# Patient Record
Sex: Female | Born: 1975 | Race: White | Hispanic: No | Marital: Married | Smoking: Never smoker
Health system: Southern US, Community
[De-identification: ages and names within clinical notes are randomized; demographics above are authoritative.]

## PROBLEM LIST (undated history)

## (undated) DIAGNOSIS — E282 Polycystic ovarian syndrome: Secondary | ICD-10-CM

## (undated) DIAGNOSIS — K635 Polyp of colon: Secondary | ICD-10-CM

## (undated) DIAGNOSIS — F32A Depression, unspecified: Secondary | ICD-10-CM

## (undated) DIAGNOSIS — F419 Anxiety disorder, unspecified: Secondary | ICD-10-CM

## (undated) DIAGNOSIS — I447 Left bundle-branch block, unspecified: Secondary | ICD-10-CM

## (undated) DIAGNOSIS — F329 Major depressive disorder, single episode, unspecified: Secondary | ICD-10-CM

## (undated) DIAGNOSIS — J45909 Unspecified asthma, uncomplicated: Secondary | ICD-10-CM

## (undated) HISTORY — PX: BREAST BIOPSY: SHX20

## (undated) HISTORY — DX: Polyp of colon: K63.5

---

## 2011-05-05 ENCOUNTER — Other Ambulatory Visit (HOSPITAL_COMMUNITY)
Admission: RE | Admit: 2011-05-05 | Discharge: 2011-05-05 | Disposition: A | Discharge status: Home / Self Care | Payer: 59 | Source: Ambulatory Visit | Attending: Obstetrics & Gynecology | Admitting: Obstetrics & Gynecology

## 2011-05-05 DIAGNOSIS — Z124 Encounter for screening for malignant neoplasm of cervix: Secondary | ICD-10-CM | POA: Insufficient documentation

## 2011-05-05 DIAGNOSIS — Z1159 Encounter for screening for other viral diseases: Secondary | ICD-10-CM | POA: Insufficient documentation

## 2011-11-11 ENCOUNTER — Encounter (HOSPITAL_BASED_OUTPATIENT_CLINIC_OR_DEPARTMENT_OTHER): Payer: Self-pay | Admitting: *Deleted

## 2011-11-17 ENCOUNTER — Encounter (HOSPITAL_BASED_OUTPATIENT_CLINIC_OR_DEPARTMENT_OTHER): Payer: Self-pay | Admitting: *Deleted

## 2011-11-17 ENCOUNTER — Ambulatory Visit (HOSPITAL_BASED_OUTPATIENT_CLINIC_OR_DEPARTMENT_OTHER): Payer: 59 | Admitting: Anesthesiology

## 2011-11-17 ENCOUNTER — Ambulatory Visit (HOSPITAL_BASED_OUTPATIENT_CLINIC_OR_DEPARTMENT_OTHER)
Admission: RE | Admit: 2011-11-17 | Discharge: 2011-11-17 | Disposition: A | Discharge status: Home / Self Care | Payer: 59 | Source: Ambulatory Visit | Attending: Plastic Surgery | Admitting: Plastic Surgery

## 2011-11-17 ENCOUNTER — Encounter (HOSPITAL_BASED_OUTPATIENT_CLINIC_OR_DEPARTMENT_OTHER): Payer: Self-pay | Admitting: Anesthesiology

## 2011-11-17 ENCOUNTER — Encounter (HOSPITAL_BASED_OUTPATIENT_CLINIC_OR_DEPARTMENT_OTHER)
Admission: RE | Disposition: A | Discharge status: Home / Self Care | Payer: Self-pay | Source: Ambulatory Visit | Attending: Plastic Surgery

## 2011-11-17 DIAGNOSIS — N62 Hypertrophy of breast: Secondary | ICD-10-CM | POA: Insufficient documentation

## 2011-11-17 HISTORY — DX: Major depressive disorder, single episode, unspecified: F32.9

## 2011-11-17 HISTORY — PX: BREAST REDUCTION SURGERY: SHX8

## 2011-11-17 HISTORY — DX: Polycystic ovarian syndrome: E28.2

## 2011-11-17 HISTORY — DX: Unspecified asthma, uncomplicated: J45.909

## 2011-11-17 HISTORY — DX: Anxiety disorder, unspecified: F41.9

## 2011-11-17 HISTORY — DX: Depression, unspecified: F32.A

## 2011-11-17 LAB — POCT HEMOGLOBIN-HEMACUE: Hemoglobin: 13.8 g/dL (ref 12.0–15.0)

## 2011-11-17 SURGERY — MAMMOPLASTY, REDUCTION
Anesthesia: General | Site: Breast | Laterality: Bilateral | Wound class: Clean

## 2011-11-17 MED ORDER — ONDANSETRON HCL 4 MG/2ML IJ SOLN
INTRAMUSCULAR | Status: DC | PRN
  Administered 2011-11-17: 4 mg via INTRAVENOUS

## 2011-11-17 MED ORDER — DEXAMETHASONE SODIUM PHOSPHATE 4 MG/ML IJ SOLN
INTRAMUSCULAR | Status: DC | PRN
  Administered 2011-11-17: 10 mg via INTRAVENOUS

## 2011-11-17 MED ORDER — LACTATED RINGERS IV SOLN
INTRAVENOUS | Status: DC
  Administered 2011-11-17 (×5): via INTRAVENOUS

## 2011-11-17 MED ORDER — BUPIVACAINE LIPOSOME 1.3 % IJ SUSP
INTRAMUSCULAR | Status: DC | PRN
  Administered 2011-11-17: 20 mL

## 2011-11-17 MED ORDER — METOCLOPRAMIDE HCL 5 MG/ML IJ SOLN
INTRAMUSCULAR | Status: DC | PRN
  Administered 2011-11-17: 10 mg via INTRAVENOUS

## 2011-11-17 MED ORDER — FENTANYL CITRATE 0.05 MG/ML IJ SOLN
INTRAMUSCULAR | Status: DC | PRN
  Administered 2011-11-17: 25 ug via INTRAVENOUS
  Administered 2011-11-17 (×3): 50 ug via INTRAVENOUS
  Administered 2011-11-17: 25 ug via INTRAVENOUS
  Administered 2011-11-17: 50 ug via INTRAVENOUS
  Administered 2011-11-17: 100 ug via INTRAVENOUS
  Administered 2011-11-17: 50 ug via INTRAVENOUS
  Administered 2011-11-17: 25 ug via INTRAVENOUS
  Administered 2011-11-17: 100 ug via INTRAVENOUS
  Administered 2011-11-17: 50 ug via INTRAVENOUS
  Administered 2011-11-17: 25 ug via INTRAVENOUS

## 2011-11-17 MED ORDER — OXYCODONE HCL 5 MG/5ML PO SOLN: 5.0000 mg | Freq: Once | ORAL | Status: DC | PRN

## 2011-11-17 MED ORDER — LIDOCAINE-EPINEPHRINE 1 %-1:100000 IJ SOLN
INTRAMUSCULAR | Status: DC | PRN
  Administered 2011-11-17: 40 mL

## 2011-11-17 MED ORDER — OXYCODONE HCL 5 MG PO TABS: 5.0000 mg | ORAL_TABLET | Freq: Once | ORAL | Status: DC | PRN

## 2011-11-17 MED ORDER — SODIUM CHLORIDE 0.9 % IJ SOLN
INTRAMUSCULAR | Status: DC | PRN
  Administered 2011-11-17: 60 mL

## 2011-11-17 MED ORDER — CEFAZOLIN SODIUM 1-5 GM-% IV SOLN
1.0000 g | Freq: Once | INTRAVENOUS | Status: AC
  Administered 2011-11-17: 2 g via INTRAVENOUS

## 2011-11-17 MED ORDER — PROMETHAZINE HCL 25 MG/ML IJ SOLN: 6.2500 mg | INTRAMUSCULAR | Status: DC | PRN

## 2011-11-17 MED ORDER — MIDAZOLAM HCL 5 MG/5ML IJ SOLN
INTRAMUSCULAR | Status: DC | PRN
  Administered 2011-11-17: 2 mg via INTRAVENOUS

## 2011-11-17 MED ORDER — HYDROMORPHONE HCL PF 1 MG/ML IJ SOLN: 0.2500 mg | INTRAMUSCULAR | Status: DC | PRN

## 2011-11-17 MED ORDER — ACETAMINOPHEN 10 MG/ML IV SOLN
1000.0000 mg | Freq: Once | INTRAVENOUS | Status: AC
  Administered 2011-11-17: 1000 mg via INTRAVENOUS

## 2011-11-17 MED ORDER — LIDOCAINE HCL (CARDIAC) 10 MG/ML IV SOLN
INTRAVENOUS | Status: DC | PRN
  Administered 2011-11-17: 80 mg via INTRAVENOUS

## 2011-11-17 MED ORDER — SUCCINYLCHOLINE CHLORIDE 20 MG/ML IJ SOLN
INTRAMUSCULAR | Status: DC | PRN
  Administered 2011-11-17: 120 mg via INTRAVENOUS

## 2011-11-17 MED ORDER — PROPOFOL 10 MG/ML IV BOLUS
INTRAVENOUS | Status: DC | PRN
  Administered 2011-11-17: 20 mg via INTRAVENOUS
  Administered 2011-11-17: 180 mg via INTRAVENOUS
  Administered 2011-11-17: 20 mg via INTRAVENOUS

## 2011-11-17 SURGICAL SUPPLY — 57 items
BANDAGE GAUZE ELAST BULKY 4 IN (GAUZE/BANDAGES/DRESSINGS) ×4 IMPLANT
BENZOIN TINCTURE PRP APPL 2/3 (GAUZE/BANDAGES/DRESSINGS) ×4 IMPLANT
BLADE KNIFE PERSONA 10 (BLADE) ×8 IMPLANT
BLADE KNIFE PERSONA 15 (BLADE) ×6 IMPLANT
CANISTER SUCTION 1200CC (MISCELLANEOUS) ×2 IMPLANT
CAP BOUFFANT 24 BLUE NURSES (PROTECTIVE WEAR) ×2 IMPLANT
CLOTH BEACON ORANGE TIMEOUT ST (SAFETY) ×2 IMPLANT
COVER MAYO STAND STRL (DRAPES) ×2 IMPLANT
COVER TABLE BACK 60X90 (DRAPES) ×2 IMPLANT
DECANTER SPIKE VIAL GLASS SM (MISCELLANEOUS) ×4 IMPLANT
DRAIN CHANNEL 10F 3/8 F FF (DRAIN) ×4 IMPLANT
DRAPE LAPAROSCOPIC ABDOMINAL (DRAPES) ×2 IMPLANT
DRSG EMULSION OIL 3X3 NADH (GAUZE/BANDAGES/DRESSINGS) ×4 IMPLANT
DRSG PAD ABDOMINAL 8X10 ST (GAUZE/BANDAGES/DRESSINGS) ×4 IMPLANT
ELECT NEEDLE TIP 2.8 STRL (NEEDLE) IMPLANT
ELECT REM PT RETURN 9FT ADLT (ELECTROSURGICAL) ×2
ELECTRODE REM PT RTRN 9FT ADLT (ELECTROSURGICAL) ×1 IMPLANT
EVACUATOR SILICONE 100CC (DRAIN) ×4 IMPLANT
FILTER 7/8 IN (FILTER) ×2 IMPLANT
GLOVE BIO SURGEON STRL SZ7 (GLOVE) ×2 IMPLANT
GLOVE BIOGEL M 7.0 STRL (GLOVE) ×2 IMPLANT
GLOVE BIOGEL PI IND STRL 7.5 (GLOVE) ×1 IMPLANT
GLOVE BIOGEL PI INDICATOR 7.5 (GLOVE) ×1
GLOVE ECLIPSE 6.5 STRL STRAW (GLOVE) ×10 IMPLANT
GOWN PREVENTION PLUS XLARGE (GOWN DISPOSABLE) ×8 IMPLANT
NEEDLE HYPO 25X1 1.5 SAFETY (NEEDLE) ×2 IMPLANT
NEEDLE SPNL 18GX3.5 QUINCKE PK (NEEDLE) ×2 IMPLANT
NS IRRIG 1000ML POUR BTL (IV SOLUTION) ×4 IMPLANT
PACK BASIN DAY SURGERY FS (CUSTOM PROCEDURE TRAY) ×2 IMPLANT
PIN SAFETY STERILE (MISCELLANEOUS) ×2 IMPLANT
SCRUB PCMX 4 OZ (MISCELLANEOUS) ×2 IMPLANT
SCRUB TECHNI CARE SURGICAL (MISCELLANEOUS) IMPLANT
SLEEVE SCD COMPRESS KNEE MED (MISCELLANEOUS) ×2 IMPLANT
SPECIMEN JAR MEDIUM (MISCELLANEOUS) ×6 IMPLANT
SPECIMEN JAR X LARGE (MISCELLANEOUS) ×4 IMPLANT
SPONGE GAUZE 4X4 12PLY (GAUZE/BANDAGES/DRESSINGS) ×4 IMPLANT
SPONGE LAP 18X18 X RAY DECT (DISPOSABLE) ×6 IMPLANT
STAPLER VISISTAT 35W (STAPLE) ×4 IMPLANT
STRIP CLOSURE SKIN 1/2X4 (GAUZE/BANDAGES/DRESSINGS) ×8 IMPLANT
SUT ETHILON 3 0 PS 1 (SUTURE) ×2 IMPLANT
SUT MNCRL AB 3-0 PS2 18 (SUTURE) ×8 IMPLANT
SUT MNCRL AB 4-0 PS2 18 (SUTURE) ×4 IMPLANT
SUT MON AB 5-0 PS2 18 (SUTURE) ×4 IMPLANT
SUT PROLENE 2 0 CT2 30 (SUTURE) ×2 IMPLANT
SUT PROLENE 3 0 PS 1 (SUTURE) ×4 IMPLANT
SUT QUILL PDO 2-0 (SUTURE) ×6 IMPLANT
SYR BULB IRRIGATION 50ML (SYRINGE) ×4 IMPLANT
SYR CONTROL 10ML LL (SYRINGE) ×6 IMPLANT
TOWEL OR 17X24 6PK STRL BLUE (TOWEL DISPOSABLE) ×6 IMPLANT
TOWEL OR NON WOVEN STRL DISP B (DISPOSABLE) ×2 IMPLANT
TRAY DSU PREP LF (CUSTOM PROCEDURE TRAY) ×2 IMPLANT
TRAY FOLEY CATH 14FR (SET/KITS/TRAYS/PACK) ×2 IMPLANT
TUBE CONNECTING 20X1/4 (TUBING) ×2 IMPLANT
UNDERPAD 30X30 INCONTINENT (UNDERPADS AND DIAPERS) ×4 IMPLANT
VAC PENCILS W/TUBING CLEAR (MISCELLANEOUS) ×2 IMPLANT
WATER STERILE IRR 1000ML POUR (IV SOLUTION) ×6 IMPLANT
YANKAUER SUCT BULB TIP NO VENT (SUCTIONS) ×2 IMPLANT

## 2011-11-17 NOTE — Anesthesia Preprocedure Evaluation (Signed)
Anesthesia Evaluation  Patient identified by MRN, date of birth, ID band Patient awake    Reviewed: Allergy & Precautions, H&P , NPO status , Patient's Chart, lab work & pertinent test results  History of Anesthesia Complications (+) AWARENESS UNDER ANESTHESIA  Airway Mallampati: I TM Distance: >3 FB Neck ROM: Full    Dental   Pulmonary asthma ,  breath sounds clear to auscultation        Cardiovascular Rhythm:Regular Rate:Normal     Neuro/Psych Anxiety Depression    GI/Hepatic   Endo/Other    Renal/GU      Musculoskeletal   Abdominal (+) + obese,   Peds  Hematology   Anesthesia Other Findings   Reproductive/Obstetrics                           Anesthesia Physical Anesthesia Plan  ASA: II  Anesthesia Plan: General   Post-op Pain Management:    Induction: Intravenous  Airway Management Planned: Oral ETT  Additional Equipment:   Intra-op Plan:   Post-operative Plan: Extubation in OR  Informed Consent: I have reviewed the patients History and Physical, chart, labs and discussed the procedure including the risks, benefits and alternatives for the proposed anesthesia with the patient or authorized representative who has indicated his/her understanding and acceptance.     Plan Discussed with: CRNA and Surgeon  Anesthesia Plan Comments:         Anesthesia Quick Evaluation

## 2011-11-17 NOTE — Anesthesia Procedure Notes (Signed)
Procedure Name: Intubation Date/Time: 11/17/2011 8:08 AM Performed by: Gar Gibbon Pre-anesthesia Checklist: Patient identified, Emergency Drugs available, Suction available and Patient being monitored Patient Re-evaluated:Patient Re-evaluated prior to inductionOxygen Delivery Method: Circle System Utilized Preoxygenation: Pre-oxygenation with 100% oxygen Intubation Type: IV induction Ventilation: Mask ventilation without difficulty Laryngoscope Size: Miller and 2 Grade View: Grade I Tube type: Oral Number of attempts: 1 Airway Equipment and Method: stylet and oral airway Placement Confirmation: ETT inserted through vocal cords under direct vision,  positive ETCO2 and breath sounds checked- equal and bilateral Tube secured with: Tape Dental Injury: Teeth and Oropharynx as per pre-operative assessment

## 2011-11-17 NOTE — Anesthesia Postprocedure Evaluation (Signed)
  Anesthesia Post-op Note  Patient: Elizabeth Flores  Procedure(s) Performed: Procedure(s) (LRB) with comments: MAMMARY REDUCTION  (BREAST) (Bilateral)  Patient Location: PACU  Anesthesia Type:General  Level of Consciousness: awake and alert   Airway and Oxygen Therapy: Patient Spontanous Breathing  Post-op Pain: mild  Post-op Assessment: Post-op Vital signs reviewed, Patient's Cardiovascular Status Stable, Respiratory Function Stable, Patent Airway, No signs of Nausea or vomiting and Pain level controlled  Post-op Vital Signs: stable  Complications: No apparent anesthesia complications

## 2011-11-17 NOTE — Brief Op Note (Signed)
11/17/2011  11:56 AM  PATIENT:  Elizabeth Flores  36 y.o. female  PRE-OPERATIVE DIAGNOSIS: Bilateral  macromastia   POST-OPERATIVE DIAGNOSIS:  Bilateral macromastia   PROCEDURE:  Procedure(s) (LRB) with comments: MAMMARY REDUCTION  (BREAST) (Bilateral)  SURGEON:  Surgeon(s) and Role:    * Lamar Blinks Zakira Ressel Hockey, MD - Primary  ANESTHESIA:   general  EBL:  Total I/O In: 2000 [I.V.:2000] Out: 450 [Urine:250; Blood:200]  BLOOD ADMINISTERED:none  DRAINS: (33F) Jackson-Pratt drain(s) with closed bulb suction in the Bilateeral Breasts   LOCAL MEDICATIONS USED:  MARCAINE     SPECIMEN:  Source of Specimen:  Bilateral breasts  DISPOSITION OF SPECIMEN:  PATHOLOGY  COUNTS:  YES  DICTATION: .Other Dictation: Dictation Number [id number]  PLAN OF CARE: Discharge to home after PACU  PATIENT DISPOSITION:  PACU - hemodynamically stable.   Delay start of Pharmacological VTE agent (>24hrs) due to surgical blood loss or risk of bleeding: not applicable

## 2011-11-17 NOTE — Transfer of Care (Signed)
Immediate Anesthesia Transfer of Care Note  Patient: Elizabeth Flores  Procedure(s) Performed: Procedure(s) (LRB) with comments: MAMMARY REDUCTION  (BREAST) (Bilateral)  Patient Location: PACU  Anesthesia Type:General  Level of Consciousness: sedated and patient cooperative  Airway & Oxygen Therapy: Patient Spontanous Breathing and Patient connected to face mask oxygen  Post-op Assessment: Report given to PACU RN and Post -op Vital signs reviewed and stable  Post vital signs: Reviewed and stable  Complications: No apparent anesthesia complications

## 2011-11-18 ENCOUNTER — Encounter (HOSPITAL_BASED_OUTPATIENT_CLINIC_OR_DEPARTMENT_OTHER): Payer: Self-pay | Admitting: Plastic Surgery

## 2011-11-18 NOTE — Op Note (Deleted)
NAME:  Elizabeth Flores, Elizabeth Flores                      ACCOUNT NO.:  MEDICAL RECORD NO.:  [id number]  LOCATION:                                 FACILITY:  PHYSICIAN:  Brantley Persons, M.D.DATE OF BIRTH:  [birth year]  DATE OF CONSULTATION:  11/17/2011 DATE OF DISCHARGE:                                CONSULTATION   PREOPERATIVE DIAGNOSIS:  Bilateral macromastia.  POSTOPERATIVE DIAGNOSIS:  Bilateral macromastia.  PROCEDURE:  Bilateral reduction mammoplasties.  ATTENDING SURGEON:  Brantley Persons, M.D.  ANESTHESIA:  General.  ANESTHESIOLOGIST:  Bedelia Person, M.D.  ESTIMATED BLOOD LOSS:  200 mL.  FLUID REPLACEMENT:  2800 mL of crystalloid.  URINE OUTPUT:  250 mL.  COMPLICATIONS:  None.  INDICATIONS FOR THE PROCEDURE:  The patient is a 36 year old Caucasian female who has bilateral macromastia.  She presents to undergo bilateral reduction mammoplasties.  PROCEDURE:  The patient was marked in the preop holding area in the pattern of Wise for the future bilateral reduction mammoplasties.  She was then taken to the OR, placed on the table in supine position.  After adequate general anesthesia was obtained, the patient's chest was prepped with Techni-Care and draped in sterile fashion.  The bases of the breast had been injected with 1% lidocaine with epinephrine.  After adequate hemostasis and anesthesia taken effect, the procedure was begun.  Both of the breast reductions were performed in the following similar manner.  The nipple-areolar complex was marked with the 45-mm nipple marker.  The skin was then incised and deepithelialized around the nipple-areolar complex down to an inframammary crease in inferior pedicle pattern.  Next, the medial, superior, and lateral skin flaps were elevated down to the chest wall.  The excess fat and glandular tissue were removed from the inferior pedicle.  The nipple-areolar complex was examined and found to be pink and viable.  The wound was then  irrigated with saline irrigation.  Meticulous hemostasis was obtained with the Bovie electrocautery.  The inferior pedicle was centralized using 3-0 Prolene suture.  A #10 JP flat fully-fluted drain was placed into the wound.  The skin flaps were brought together at the inverted T junction with a 2-0 Prolene suture.  The incisions were stapled for temporary closure.  The breasts were compared and found to have good shape and symmetry.  The incisions were then closed from the medial aspect of the JP drain to the medial aspect of the [facility] incision by first placing a few 3-0 Monocryl sutures to loosely tack together the dermal layer, and then both the dermal and cuticular layer were closed in a single layer using a 2-0 Quill PDO barbed suture.  Lateral to the JP drain, the incision was closed using 3-0 Monocryl in the dermal layer followed by 3-0 Monocryl running intracuticular stitch on the skin.  The vertical limb of the Wise pattern was closed with 3-0 Monocryl in the dermal layer.  The patient was placed in the upright position.  The future location in the nipple-areolar complexes was marked on both breast mounds using the 45-mm nipple marker.  She was then placed back in the recumbent position.  Both of the nipple-areolar complexes  were brought out onto the breast mounds in the following similar manner.  The skin was incised as marked and removed full thickness into the subcutaneous tissues.  The nipple-areolar complexes were examined, found to be pink and viable, and brought out through this aperture and sewn in place using 4-0 Monocryl in the dermal layer followed by 5-0 Monocryl running intracuticular stitch on the skin.  This 5-0 Monocryl was then brought down to close the cuticular layer of the vertical limb as well.  The JP drain was sewn in place using 3-0 nylon suture.  Prior to closing the breast, the pectoralis major muscle, and now the skin and subcutaneous tissues at the  base of the incisions and circumferentially around the breast tissue were infiltrated with 1.3% Exparel (total 266 mg) to provide postsurgical analgesia.  The incisions were dressed with benzoin and Steri-Strips and the nipples additionally bacitracin ointment and Adaptic.  4x4s were placed over the incisions and ABD pads in the axillary areas.  She was then placed into light postoperative support bra.  There were no complications.  The patient tolerated the procedure well.  The patient was then extubated, taken to the recovery room in stable condition.  She was recovered without complications.  Both the patient and her family were given proper postoperative wound care instructions including care of the JP drain.  She was then discharged home in the care of her family in stable condition.  Followup appointment will be Friday in the office.          ______________________________ Brantley Persons, M.D.     MC/MEDQ  D:  11/17/2011  T:  11/18/2011  Job:  [id number]

## 2011-11-23 NOTE — Consult Note (Deleted)
NAMESARRA, Elizabeth Flores                 ACCOUNT NO.:  [id number]  MEDICAL RECORD NO.:  [id number]  LOCATION:                                 FACILITY:  PHYSICIAN:  Brantley Persons, M.D.DATE OF BIRTH:  [birth year]  DATE OF CONSULTATION:  11/17/2011 DATE OF DISCHARGE:                                CONSULTATION   PREOPERATIVE DIAGNOSIS:  Bilateral macromastia.  POSTOPERATIVE DIAGNOSIS:  Bilateral macromastia.  PROCEDURE:  Bilateral reduction mammoplasties.  ATTENDING SURGEON:  Brantley Persons, M.D.  ANESTHESIA:  General.  ANESTHESIOLOGIST:  Bedelia Person, M.D.  ESTIMATED BLOOD LOSS:  200 mL.  FLUID REPLACEMENT:  2800 mL of crystalloid.  URINE OUTPUT:  250 mL.  COMPLICATIONS:  None.  INDICATIONS FOR THE PROCEDURE:  The patient is a 36 year old Caucasian female who has bilateral macromastia.  She presents to undergo bilateral reduction mammoplasties.  PROCEDURE:  The patient was marked in the preop holding area in the pattern of Wise for the future bilateral reduction mammoplasties.  She was then taken to the OR, placed on the table in supine position.  After adequate general anesthesia was obtained, the patient's chest was prepped with Techni-Care and draped in sterile fashion.  The bases of the breast had been injected with 1% lidocaine with epinephrine.  After adequate hemostasis and anesthesia taken effect, the procedure was begun.  Both of the breast reductions were performed in the following similar manner.  The nipple-areolar complex was marked with the 45-mm nipple marker.  The skin was then incised and deepithelialized around the nipple-areolar complex down to an inframammary crease in inferior pedicle pattern.  Next, the medial, superior, and lateral skin flaps were elevated down to the chest wall.  The excess fat and glandular tissue were removed from the inferior pedicle.  The nipple-areolar complex was examined and found to be pink and viable.  The wound  was then irrigated with saline irrigation.  Meticulous hemostasis was obtained with the Bovie electrocautery.  The inferior pedicle was centralized using 3-0 Prolene suture.  A #10 JP flat fully-fluted drain was placed into the wound.  The skin flaps were brought together at the inverted T junction with a 2-0 Prolene suture.  The incisions were stapled for temporary closure.  The breasts were compared and found to have good shape and symmetry.  The incisions were then closed from the medial aspect of the JP drain to the medial aspect of the [facility] incision by first placing a few 3-0 Monocryl sutures to loosely tack together the dermal layer, and then both the dermal and cuticular layer were closed in a single layer using a 2-0 Quill PDO barbed suture.  Lateral to the JP drain, the incision was closed using 3-0 Monocryl in the dermal layer followed by 3-0 Monocryl running intracuticular stitch on the skin.  The vertical limb of the Wise pattern was closed with 3-0 Monocryl in the dermal layer.  The patient was placed in the upright position.  The future location in the nipple-areolar complexes was marked on both breast mounds using the 45-mm nipple marker.  She was then placed back in the recumbent position.  Both of the nipple-areolar complexes were brought out  onto the breast mounds in the following similar manner.  The skin was incised as marked and removed full thickness into the subcutaneous tissues.  The nipple-areolar complexes were examined, found to be pink and viable, and brought out through this aperture and sewn in place using 4-0 Monocryl in the dermal layer followed by 5-0 Monocryl running intracuticular stitch on the skin.  This 5-0 Monocryl was then brought down to close the cuticular layer of the vertical limb as well.  The JP drain was sewn in place using 3-0 nylon suture.  Prior to closing the breast, the pectoralis major muscle, and now the skin and subcutaneous tissues  at the base of the incisions and circumferentially around the breast tissue were infiltrated with 1.3% Exparel (total 266 mg) to provide postsurgical analgesia.  The incisions were dressed with benzoin and Steri-Strips and the nipples additionally bacitracin ointment and Adaptic.  4x4s were placed over the incisions and ABD pads in the axillary areas.  She was then placed into light postoperative support bra.  There were no complications.  The patient tolerated the procedure well.  The patient was then extubated, taken to the recovery room in stable condition.  She was recovered without complications.  Both the patient and her family were given proper postoperative wound care instructions including care of the JP drain.  She was then discharged home in the care of her family in stable condition.  Followup appointment will be Friday in the office.          ______________________________ Brantley Persons, M.D.     MC/MEDQ  D:  11/17/2011  T:  11/18/2011  Job:  [id number]

## 2011-12-15 NOTE — Op Note (Signed)
NAMEDEANZA, UPPERMAN                 ACCOUNT NO.:  [id number]  MEDICAL RECORD NO.:  [id number]  LOCATION:                                 FACILITY:  PHYSICIAN:  Brantley Persons, M.D.DATE OF BIRTH:  10-03-1975  DATE OF PROCEDURE:  11/17/2011 DATE OF DISCHARGE:                              OPERATIVE REPORT   PREOPERATIVE DIAGNOSIS:  Bilateral macromastia.  POSTOPERATIVE DIAGNOSIS:  Bilateral macromastia.  PROCEDURE:  Bilateral reduction mammoplasties.  ATTENDING SURGEON:  Brantley Persons, M.D.  ANESTHESIA:  General.  ANESTHESIOLOGIST:  Bedelia Person, M.D.  ESTIMATED BLOOD LOSS:  200 mL.  FLUID REPLACEMENT:  2800 mL of crystalloid.  URINE OUTPUT:  250 mL.  COMPLICATIONS:  None.  INDICATIONS FOR THE PROCEDURE:  The patient is a 36 year old Caucasian female who has bilateral macromastia.  She presents to undergo bilateral reduction mammoplasties.  PROCEDURE:  The patient was marked in the preop holding area in the pattern of Wise for the future bilateral reduction mammoplasties.  She was then taken to the OR, placed on the table in supine position.  After adequate general anesthesia was obtained, the patient's chest was prepped with Techni-Care and draped in sterile fashion.  The bases of the breast had been injected with 1% lidocaine with epinephrine.  After adequate hemostasis and anesthesia taken effect, the procedure was begun.  Both of the breast reductions were performed in the following similar manner.  The nipple-areolar complex was marked with the 45-mm nipple marker.  The skin was then incised and deepithelialized around the nipple-areolar complex down to an inframammary crease in inferior pedicle pattern.  Next, the medial, superior, and lateral skin flaps were elevated down to the chest wall.  The excess fat and glandular tissue were removed from the inferior pedicle.  The nipple-areolar complex was examined and found to be pink and viable.  The wound  was then irrigated with saline irrigation.  Meticulous hemostasis was obtained with the Bovie electrocautery.  The inferior pedicle was centralized using 3-0 Prolene suture.  A #10 JP flat fully-fluted drain was placed into the wound.  The skin flaps were brought together at the inverted T junction with a 2-0 Prolene suture.  The incisions were stapled for temporary closure.  The breasts were compared and found to have good shape and symmetry.  The incisions were then closed from the medial aspect of the JP drain to the medial aspect of the [facility] incision by first placing a few 3-0 Monocryl sutures to loosely tack together the dermal layer, and then both the dermal and cuticular layer were closed in a single layer using a 2-0 Quill PDO barbed suture.  Lateral to the JP drain, the incision was closed using 3-0 Monocryl in the dermal layer followed by 3-0 Monocryl running intracuticular stitch on the skin.  The vertical limb of the Wise pattern was closed with 3-0 Monocryl in the dermal layer.  The patient was placed in the upright position.  The future location in the nipple-areolar complexes was marked on both breast mounds using the 45-mm nipple marker.  She was then placed back in the recumbent position.  Both of the nipple-areolar complexes were brought out onto  the breast mounds in the following similar manner.  The skin was incised as marked and removed full thickness into the subcutaneous tissues.  The nipple-areolar complexes were examined, found to be pink and viable, and brought out through this aperture and sewn in place using 4-0 Monocryl in the dermal layer followed by 5-0 Monocryl running intracuticular stitch on the skin.  This 5-0 Monocryl was then brought down to close the cuticular layer of the vertical limb as well.  The JP drain was sewn in place using 3-0 nylon suture.  Prior to closing the breast, the pectoralis major muscle, and now the skin and subcutaneous tissues  at the base of the incisions and circumferentially around the breast tissue were infiltrated with 1.3% Exparel (total 266 mg) to provide postsurgical analgesia.  The incisions were dressed with benzoin and Steri-Strips and the nipples additionally bacitracin ointment and Adaptic.  4x4s were placed over the incisions and ABD pads in the axillary areas.  She was then placed into light postoperative support bra.  There were no complications.  The patient tolerated the procedure well.  The patient was then extubated, taken to the recovery room in stable condition.  She was recovered without complications.  Both the patient and her family were given proper postoperative wound care instructions including care of the JP drain.  She was then discharged home in the care of her family in stable condition.  Followup appointment will be Friday in the office.          ______________________________ Brantley Persons, M.D.     MC/MEDQ  D:  11/17/2011  T:  11/18/2011  Job:  [id number]

## 2012-11-24 ENCOUNTER — Ambulatory Visit (HOSPITAL_BASED_OUTPATIENT_CLINIC_OR_DEPARTMENT_OTHER): Payer: 59 | Admitting: Genetic Counselor

## 2012-11-24 ENCOUNTER — Encounter: Payer: Self-pay | Admitting: Genetic Counselor

## 2012-11-24 ENCOUNTER — Other Ambulatory Visit: Payer: 59 | Admitting: Lab

## 2012-11-24 DIAGNOSIS — K635 Polyp of colon: Secondary | ICD-10-CM | POA: Insufficient documentation

## 2012-11-24 DIAGNOSIS — Z800 Family history of malignant neoplasm of digestive organs: Secondary | ICD-10-CM

## 2012-11-24 DIAGNOSIS — IMO0002 Reserved for concepts with insufficient information to code with codable children: Secondary | ICD-10-CM

## 2012-11-24 DIAGNOSIS — Z86010 Personal history of colonic polyps: Secondary | ICD-10-CM

## 2012-11-24 DIAGNOSIS — Z803 Family history of malignant neoplasm of breast: Secondary | ICD-10-CM

## 2012-11-24 NOTE — Progress Notes (Signed)
Dr.  Arlyce Harman requested a consultation for genetic counseling and risk assessment for Elizabeth Flores, a 37 y.o. 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  She presents to clinic today to discuss the possibility of a genetic predisposition to cancer, and to further clarify her risks, as well as her family members' risks for cancer.   HISTORY OF PRESENT ILLNESS: Elizabeth Flores is a 37 y.o. female with no personal history of cancer. Her sister was diagnosed with breast cancer at the ages of 77 and 38.  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  The patient's father was tested and also found to have this mutation.  She is unaware whether her sister had genetic testing for other hereditary breast cancer genes.  The patient has had 3 colonoscopies which has found several polyps and one flat polyp which she had removed in [facility] [place].  She has had two mammograms starting at 32 years but no breast MRI.  She had a right breast biopsy which was benign.  Past Medical History  Diagnosis Date  . Anxiety   . Depression   . PCOS (polycystic ovarian syndrome)     takes metformin  . Asthma     allergy induced  . Colon polyps     had flat polyp removed in [facility]    Past Surgical History  Procedure Laterality Date  . Breast biopsy      rt  . Breast reduction surgery  11/17/2011    Procedure: MAMMARY REDUCTION  (BREAST);  Surgeon: Karie Fetch, MD;  Location: [facility];  Service: Plastics;  Laterality: Bilateral;    History   Social History  . Marital Status: Married    Spouse Name: N/A    Number of Children: 2  . Years of Education: N/A   Occupational History  .     Social History Main Topics  . Smoking status: Never Smoker   . Smokeless tobacco: Never Used  . Alcohol Use: Not on file     Comment: social  . Drug Use:  No  . Sexual Activity: Yes    Birth Control/ Protection: Condom   Other Topics Concern  . Not on file   Social History Narrative  . No narrative on file    REPRODUCTIVE HISTORY AND PERSONAL RISK ASSESSMENT FACTORS: Menarche was at age 38.   premenopausal Uterus Intact: yes Ovaries Intact:yes G2P2A0, first live birth at age 46  She has previously undergone treatment for infertility.   Oral Contraceptive use: 15 years   She has not used HRT in the past.    FAMILY HISTORY:  We obtained a detailed, 4-generation family history.  Significant diagnoses are listed below: Family History  Problem Relation Age of Onset  . Colon cancer Father 57    diagnosed again at 20; Lynch syndrome with MLH1 mutation  . Colon polyps Sister   . ALS Maternal Aunt   . COPD Maternal Grandfather     smoker  . Breast cancer Sister 57    bilateral breast cancer dx again at 41; Lynch syndrome with MLH1 mutation  . Endometriosis Sister   . Colon cancer Paternal Uncle     diagnosed in his 74s   Patient's maternal ancestors are of [place] and [place] descent, and paternal ancestors are of [place] descent. There is no reported Ashkenazi  Jewish ancestry. There is no known consanguinity.  GENETIC COUNSELING ASSESSMENT: Elizabeth Flores is a 37 y.o. 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 We, therefore, discussed and recommended the following at today's visit.   DISCUSSION: We reviewed the characteristics, features and inheritance patterns of hereditary cancer syndromes. We also discussed genetic testing, including the appropriate family members to test, the process of testing, insurance coverage and turn-around-time for results. We discussed that Lynch syndrome makes up about 3% of hereditary colon cancers, and is caused by mutations within one of 5 MMR genes.   We reviewed the function of MMR genes and that they repair DNA damage.  Lynch syndrome is associated with colon, uterine and ovarian cancer, as well as other GI cancers.  We discussed that in some families breast cancer is also associated with Lynch syndrome, although we would recommend that her sister consider genetic testing for hereditary breast cancer genes, if that has not already been done.  Based on her sister and father's diagnosis of Lynch syndrome, the patient has a 50% chance of also testing positive.  PLAN: After considering the risks, benefits, and limitations, Elizabeth Flores provided informed consent to pursue genetic testing and the blood sample will be sent to [facility] for analysis of the MLH1 familial mutation. We discussed the implications of a positive, negative and/ or variant of uncertain significance genetic test result. Results should be available within approximately 1-2 weeks' time, at which point they will be disclosed by telephone to Elizabeth Flores, as will any additional recommendations warranted by these results. Elizabeth Flores will receive a summary of her genetic counseling visit and a copy of her results once available. This information will also be available in Epic. We encouraged Elizabeth Flores to remain in contact with cancer genetics annually so that we can continuously update the family history and inform her of any changes in cancer genetics and testing that may be of benefit for her family. Elizabeth Flores's questions were answered to her satisfaction today. Our contact information was provided should additional questions or concerns arise.  The patient was seen for a total of 60 minutes, greater than 50% of which was spent face-to-face counseling.  This note will also be sent to the referring provider via the electronic medical record. The patient will be supplied with a summary of this genetic counseling discussion as well as educational information on the  discussed hereditary cancer syndromes following the conclusion of their visit.   Patient was discussed with Dr. Drue Second.   _______________________________________________________________________ For Office Staff:  Number of people involved in session: 1 Was an Intern/ student involved with case: no

## 2012-12-01 ENCOUNTER — Telehealth: Payer: Self-pay | Admitting: Genetic Counselor

## 2012-12-01 ENCOUNTER — Encounter: Payer: Self-pay | Admitting: Genetic Counselor

## 2012-12-01 NOTE — Telephone Encounter (Signed)
Revealed negative familial mutation testing in MLH1.  Discussed that her sister was diagnosed twice with breast cancer at a young age (35 and 37), and recommend that her sister get tested for hereditary breast cancer syndromes. Elizabeth Flores states her understanding.

## 2013-03-24 ENCOUNTER — Emergency Department (HOSPITAL_BASED_OUTPATIENT_CLINIC_OR_DEPARTMENT_OTHER)
Admission: EM | Admit: 2013-03-24 | Discharge: 2013-03-24 | Disposition: A | Discharge status: Other Acute Inpatient Hospital | Payer: 59 | Attending: Emergency Medicine | Admitting: Emergency Medicine

## 2013-03-24 ENCOUNTER — Encounter (HOSPITAL_BASED_OUTPATIENT_CLINIC_OR_DEPARTMENT_OTHER): Payer: Self-pay | Admitting: Emergency Medicine

## 2013-03-24 ENCOUNTER — Emergency Department (HOSPITAL_BASED_OUTPATIENT_CLINIC_OR_DEPARTMENT_OTHER): Payer: 59

## 2013-03-24 DIAGNOSIS — R109 Unspecified abdominal pain: Secondary | ICD-10-CM

## 2013-03-24 DIAGNOSIS — Z860100 Personal history of colon polyps, unspecified: Secondary | ICD-10-CM | POA: Insufficient documentation

## 2013-03-24 DIAGNOSIS — F3289 Other specified depressive episodes: Secondary | ICD-10-CM | POA: Insufficient documentation

## 2013-03-24 DIAGNOSIS — F411 Generalized anxiety disorder: Secondary | ICD-10-CM | POA: Insufficient documentation

## 2013-03-24 DIAGNOSIS — J45909 Unspecified asthma, uncomplicated: Secondary | ICD-10-CM | POA: Insufficient documentation

## 2013-03-24 DIAGNOSIS — N390 Urinary tract infection, site not specified: Secondary | ICD-10-CM | POA: Insufficient documentation

## 2013-03-24 DIAGNOSIS — F329 Major depressive disorder, single episode, unspecified: Secondary | ICD-10-CM | POA: Insufficient documentation

## 2013-03-24 DIAGNOSIS — R3919 Other difficulties with micturition: Secondary | ICD-10-CM | POA: Insufficient documentation

## 2013-03-24 DIAGNOSIS — Z79899 Other long term (current) drug therapy: Secondary | ICD-10-CM | POA: Insufficient documentation

## 2013-03-24 DIAGNOSIS — Z3202 Encounter for pregnancy test, result negative: Secondary | ICD-10-CM | POA: Insufficient documentation

## 2013-03-24 DIAGNOSIS — Z8742 Personal history of other diseases of the female genital tract: Secondary | ICD-10-CM | POA: Insufficient documentation

## 2013-03-24 HISTORY — DX: Left bundle-branch block, unspecified: I44.7

## 2013-03-24 LAB — CBC WITH DIFFERENTIAL/PLATELET
Basophils Absolute: 0.1 10*3/uL (ref 0.0–0.1)
Basophils Relative: 0 % (ref 0–1)
EOS PCT: 1 % (ref 0–5)
Eosinophils Absolute: 0.3 10*3/uL (ref 0.0–0.7)
HEMATOCRIT: 37.7 % (ref 36.0–46.0)
HEMOGLOBIN: 13.1 g/dL (ref 12.0–15.0)
LYMPHS ABS: 2.9 10*3/uL (ref 0.7–4.0)
LYMPHS PCT: 14 % (ref 12–46)
MCH: 31.3 pg (ref 26.0–34.0)
MCHC: 34.7 g/dL (ref 30.0–36.0)
MCV: 90.2 fL (ref 78.0–100.0)
MONO ABS: 1.7 10*3/uL — AB (ref 0.1–1.0)
MONOS PCT: 8 % (ref 3–12)
Neutro Abs: 16 10*3/uL — ABNORMAL HIGH (ref 1.7–7.7)
Neutrophils Relative %: 76 % (ref 43–77)
Platelets: 182 10*3/uL (ref 150–400)
RBC: 4.18 MIL/uL (ref 3.87–5.11)
RDW: 12.9 % (ref 11.5–15.5)
WBC: 20.9 10*3/uL — AB (ref 4.0–10.5)

## 2013-03-24 LAB — URINALYSIS, ROUTINE W REFLEX MICROSCOPIC
Bilirubin Urine: NEGATIVE
Glucose, UA: NEGATIVE mg/dL
Hgb urine dipstick: NEGATIVE
KETONES UR: NEGATIVE mg/dL
NITRITE: NEGATIVE
PROTEIN: NEGATIVE mg/dL
Specific Gravity, Urine: 1.018 (ref 1.005–1.030)
Urobilinogen, UA: 1 mg/dL (ref 0.0–1.0)
pH: 6.5 (ref 5.0–8.0)

## 2013-03-24 LAB — PREGNANCY, URINE: PREG TEST UR: NEGATIVE

## 2013-03-24 LAB — URINE MICROSCOPIC-ADD ON

## 2013-03-24 LAB — LIPASE, BLOOD: Lipase: 38 U/L (ref 11–59)

## 2013-03-24 LAB — BASIC METABOLIC PANEL
BUN: 8 mg/dL (ref 6–23)
CALCIUM: 8.8 mg/dL (ref 8.4–10.5)
CO2: 22 meq/L (ref 19–32)
Chloride: 101 mEq/L (ref 96–112)
Creatinine, Ser: 0.7 mg/dL (ref 0.50–1.10)
GFR calc Af Amer: >90 mL/min (ref >90)
GFR calc non Af Amer: >90 mL/min (ref >90)
GLUCOSE: 126 mg/dL — AB (ref 70–99)
Potassium: 4 mEq/L (ref 3.7–5.3)
SODIUM: 137 meq/L (ref 137–147)

## 2013-03-24 MED ORDER — FENTANYL CITRATE 0.05 MG/ML IJ SOLN
100.0000 ug | Freq: Once | INTRAMUSCULAR | Status: AC
  Administered 2013-03-24: 100 ug via INTRAVENOUS
  Filled 2013-03-24: qty 2

## 2013-03-24 MED ORDER — DEXTROSE 5 % IV SOLN
1.0000 g | Freq: Once | INTRAVENOUS | Status: AC
  Administered 2013-03-24: 1 g via INTRAVENOUS

## 2013-03-24 MED ORDER — SODIUM CHLORIDE 0.9 % IV SOLN
INTRAVENOUS | Status: DC
  Administered 2013-03-24: 07:00:00 via INTRAVENOUS

## 2013-03-24 MED ORDER — CEFTRIAXONE SODIUM 1 G IJ SOLR
INTRAMUSCULAR | Status: DC
Start: 2013-03-24 — End: 2013-03-24
  Filled 2013-03-24: qty 10

## 2013-03-24 NOTE — ED Notes (Signed)
Patient transported to CT 

## 2013-03-24 NOTE — ED Notes (Signed)
MD at bedside. 

## 2013-03-24 NOTE — ED Notes (Signed)
Pt reports that she developed left flank pain on Monday, assumed it was a stomach ache, Wednesday she developed back pain and last pm the pain became so severe she could not sleep

## 2013-03-24 NOTE — ED Notes (Signed)
Pt denies any previous history of kidney stones, denies frequent urination or dysuria, took ibuprofen for back pain last pm, which helped pain, but then it returned this am and awoke pt from sleep

## 2013-03-24 NOTE — ED Provider Notes (Signed)
CSN: [id number]     Arrival date & time 03/24/13  4765 History   First MD Initiated Contact with Patient 03/24/13 0440     Chief Complaint  Patient presents with  . Flank Pain     (Consider location/radiation/quality/duration/timing/severity/associated sxs/prior Treatment) HPI This is a 38 year old female with a four-day history of left upper quadrant abdominal pain that has subsequently migrated to the left flank. It became severe yesterday evening to the point that she could not sleep. It has been associated with dark urine but no dysuria, nausea, vomiting, fever, chills, diarrhea or abnormal vaginal bleeding (although she did just have her regular menses). Pain is worse with movement or palpation.  Past Medical History  Diagnosis Date  . Anxiety   . Depression   . PCOS (polycystic ovarian syndrome)     takes metformin  . Asthma     allergy induced  . Colon polyps     had flat polyp removed in [facility]  . Left bundle branch block    Past Surgical History  Procedure Laterality Date  . Breast biopsy      rt  . Breast reduction surgery  11/17/2011    Procedure: MAMMARY REDUCTION  (BREAST);  Surgeon: Charlene Brooke, MD;  Location: [place];  Service: Plastics;  Laterality: Bilateral;   Family History  Problem Relation Age of Onset  . Colon cancer Father 35    diagnosed again at 59; Lynch syndrome with MLH1 mutation  . Colon polyps Sister   . ALS Maternal Aunt   . COPD Maternal Grandfather     smoker  . Breast cancer Sister 35    bilateral breast cancer dx again at 16; Lynch syndrome with MLH1 mutation  . Endometriosis Sister   . Colon cancer Paternal Uncle     diagnosed in his 64s   History  Substance Use Topics  . Smoking status: Never Smoker   . Smokeless tobacco: Never Used  . Alcohol Use: Not on file     Comment: social   OB History   Grav Para Term Preterm Abortions TAB SAB Ect Mult Living                 Review of Systems  All  other systems reviewed and are negative.   Allergies  Review of patient's allergies indicates no known allergies.  Home Medications   Current Outpatient Rx  Name  Route  Sig  Dispense  Refill  . diltiazem (CARDIZEM) 30 MG tablet   Oral   Take 30 mg by mouth 4 (four) times daily.         Marland Kitchen albuterol (PROVENTIL HFA;VENTOLIN HFA) 108 (90 BASE) MCG/ACT inhaler   Inhalation   Inhale 2 puffs into the lungs every 6 (six) hours as needed.         . cetirizine (ZYRTEC) 10 MG tablet   Oral   Take 10 mg by mouth daily.         Marland Kitchen escitalopram (LEXAPRO) 10 MG tablet   Oral   Take 10 mg by mouth daily.         . metformin (FORTAMET) 500 MG (OSM) 24 hr tablet   Oral   Take 500 mg by mouth daily with breakfast.          BP 128/77  Pulse 110  Temp(Src) 98.4 F (36.9 C) (Oral)  Resp 16  Ht 5' 1"  (1.549 m)  Wt 170 lb (77.111 kg)  BMI 32.14 kg/m2  SpO2 100%  LMP 03/18/2013  Physical Exam General: Well-developed, well-nourished female in no acute distress; appearance consistent with age of record HENT: normocephalic; atraumatic Eyes: pupils equal, round and reactive to light; extraocular muscles intact Neck: supple Heart: regular rate and rhythm Lungs: clear to auscultation bilaterally Abdomen: soft; nondistended; left upper quadrant tenderness; no masses or hepatosplenomegaly; bowel sounds present GU: Left CVA tenderness Extremities: No deformity; full range of motion; pulses normal Neurologic: Awake, alert and oriented; motor function intact in all extremities and symmetric; no facial droop Skin: Warm and dry Psychiatric: Normal mood and affect    ED Course  Procedures (including critical care time)   MDM   Nursing notes and vitals signs, including pulse oximetry, reviewed.  Summary of this visit's results, reviewed by myself:  Labs:  Results for orders placed during the hospital encounter of 03/24/13 (from the past 24 hour(s))  URINALYSIS, ROUTINE W  REFLEX MICROSCOPIC     Status: Abnormal   Collection Time    03/24/13  4:15 AM      Result Value Ref Range   Color, Urine YELLOW  YELLOW   APPearance CLOUDY (*) CLEAR   Specific Gravity, Urine 1.018  1.005 - 1.030   pH 6.5  5.0 - 8.0   Glucose, UA NEGATIVE  NEGATIVE mg/dL   Hgb urine dipstick NEGATIVE  NEGATIVE   Bilirubin Urine NEGATIVE  NEGATIVE   Ketones, ur NEGATIVE  NEGATIVE mg/dL   Protein, ur NEGATIVE  NEGATIVE mg/dL   Urobilinogen, UA 1.0  0.0 - 1.0 mg/dL   Nitrite NEGATIVE  NEGATIVE   Leukocytes, UA MODERATE (*) NEGATIVE  PREGNANCY, URINE     Status: None   Collection Time    03/24/13  4:15 AM      Result Value Ref Range   Preg Test, Ur NEGATIVE  NEGATIVE  URINE MICROSCOPIC-ADD ON     Status: Abnormal   Collection Time    03/24/13  4:15 AM      Result Value Ref Range   Squamous Epithelial / LPF FEW (*) RARE   WBC, UA 11-20  <3 WBC/hpf   RBC / HPF 0-2  <3 RBC/hpf   Bacteria, UA MANY (*) RARE   Urine-Other MUCOUS PRESENT    CBC WITH DIFFERENTIAL     Status: Abnormal   Collection Time    03/24/13  6:30 AM      Result Value Ref Range   WBC 20.9 (*) 4.0 - 10.5 K/uL   RBC 4.18  3.87 - 5.11 MIL/uL   Hemoglobin 13.1  12.0 - 15.0 g/dL   HCT 37.7  36.0 - 46.0 %   MCV 90.2  78.0 - 100.0 fL   MCH 31.3  26.0 - 34.0 pg   MCHC 34.7  30.0 - 36.0 g/dL   RDW 12.9  11.5 - 15.5 %   Platelets 182  150 - 400 K/uL   Neutrophils Relative % 76  43 - 77 %   Neutro Abs 16.0 (*) 1.7 - 7.7 K/uL   Lymphocytes Relative 14  12 - 46 %   Lymphs Abs 2.9  0.7 - 4.0 K/uL   Monocytes Relative 8  3 - 12 %   Monocytes Absolute 1.7 (*) 0.1 - 1.0 K/uL   Eosinophils Relative 1  0 - 5 %   Eosinophils Absolute 0.3  0.0 - 0.7 K/uL   Basophils Relative 0  0 - 1 %   Basophils Absolute 0.1  0.0 - 0.1 K/uL  BASIC METABOLIC  PANEL     Status: Abnormal   Collection Time    03/24/13  6:30 AM      Result Value Ref Range   Sodium 137  137 - 147 mEq/L   Potassium 4.0  3.7 - 5.3 mEq/L   Chloride 101  96 -  112 mEq/L   CO2 22  19 - 32 mEq/L   Glucose, Bld 126 (*) 70 - 99 mg/dL   BUN 8  6 - 23 mg/dL   Creatinine, Ser 0.70  0.50 - 1.10 mg/dL   Calcium 8.8  8.4 - 10.5 mg/dL   GFR calc non Af Amer >90  >90 mL/min   GFR calc Af Amer >90  >90 mL/min  LIPASE, BLOOD     Status: None   Collection Time    03/24/13  6:30 AM      Result Value Ref Range   Lipase 38  11 - 59 U/L    Imaging Studies: Ct Abdomen Pelvis Wo Contrast  03/24/2013   CLINICAL DATA:  Left flank pain  EXAM: CT ABDOMEN AND PELVIS WITHOUT CONTRAST  TECHNIQUE: Multidetector CT imaging of the abdomen and pelvis was performed following the standard protocol without intravenous contrast.  COMPARISON:  None available  FINDINGS: A small left pleural effusion with associated left basilar atelectasis is present.  Limited noncontrast evaluation of the liver is unremarkable. Gallbladder is within normal limits. Spleen is normal. Adrenal glands are within normal limits.  The pancreatic body and tail are ill defined with extensive inflammatory fat stranding within the adjacent peripancreatic fat in the left upper quadrant (series 2, image 21). Findings are most suggestive of acute pancreatitis. No loculated fluid collection to suggest pseudocyst formation. Small amount of free fluid seen on the inferior surface of the spleen. Evaluation for pancreatic necrosis limited on this non contrast examination.  Scattered nonobstructive stones measuring up to 6 mm is present within the right kidney. No obstructive uropathy on the right. No left-sided renal calculi are identified. There is no left-sided hydronephrosis or hydroureter.  No evidence of bowel obstruction. Appendix is normal. No abnormal wall thickening or inflammatory fat stranding seen about the bowels. There is mild thickening of the proximal descending colon at the level of the splenic flexure, likely related to ongoing inflammatory changes within the adjacent pancreas.  Bladder is unremarkable.   Uterus and ovaries within normal limits.  No free air identified. No enlarged intra-abdominal pelvic lymph nodes.  No acute osseous abnormality. No worrisome lytic or blastic osseous lesions.  IMPRESSION: 1. Ill definition of the pancreatic tail with extensive inflammatory fat stranding within the adjacent peripancreatic fat in the left upper quadrant. Findings are most compatible with acute pancreatitis. Correlation with amylase/lipase recommended. No loculated fluid collection identified. 2. Nonobstructive right renal nephrolithiasis as above. No CT evidence of obstructive uropathy. No left-sided renal calculi identified. 3. Small left pleural effusion.   Electronically Signed   By: Jeannine Boga M.D.   On: 03/24/2013 06:48   7:54 AM Patient's CT findings are consistent with pancreatitis involving the tail of the pancreas although the patient's lipase is within normal limits. We'll have her admitted for further evaluation and treatment.     Wynetta Fines, MD 03/24/13 [phone]

## 2013-03-25 LAB — URINE CULTURE
Colony Count: NO GROWTH
Culture: NO GROWTH

## 2014-08-16 IMAGING — CT CT ABD-PELV W/O CM
2 of 4 series · 15 of 46 positions shown, 17 images · non-contrast
Comparison: None available

CLINICAL DATA: Left flank pain

EXAM:
CT ABDOMEN AND PELVIS WITHOUT CONTRAST
TECHNIQUE: Multidetector CT imaging of the abdomen and pelvis was performed
following the standard protocol without intravenous contrast.

[Series 2: renal stone < 200 lbs 5.0 b31f · axial · 0.80mm/px · z∈[+748,+1188]mm · 12 of 96 slices shown, 14 images]
[im 4/96  soft-tissue]
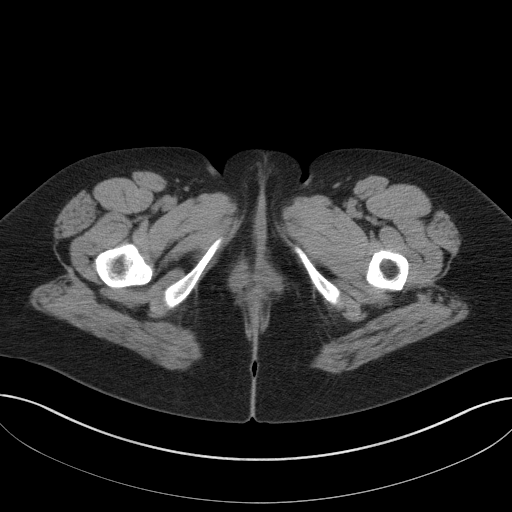
[im 4/96  bone]
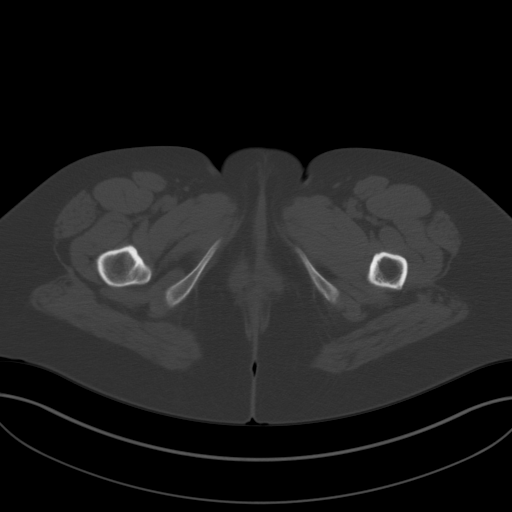
[im 12/96  soft-tissue]
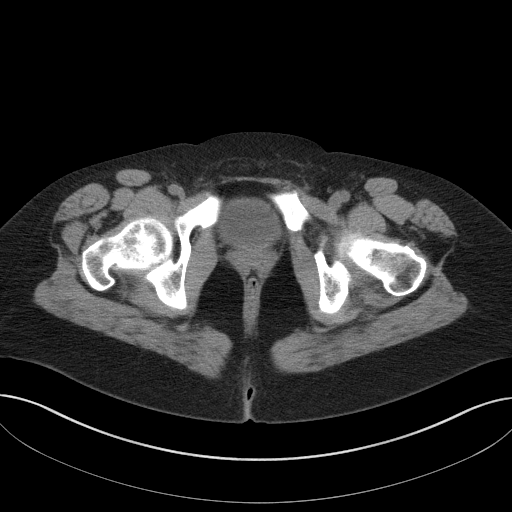
[im 20/96  soft-tissue]
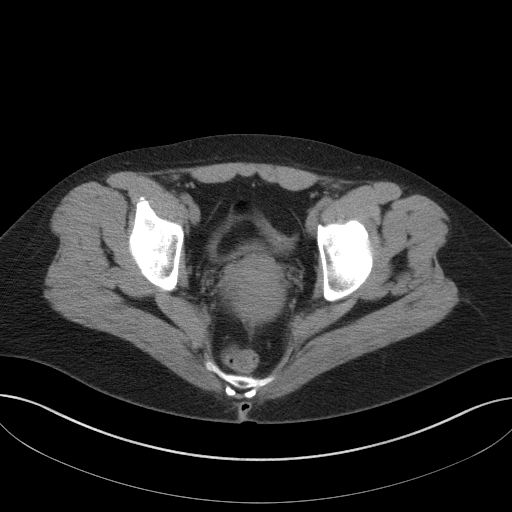
[im 28/96  soft-tissue]
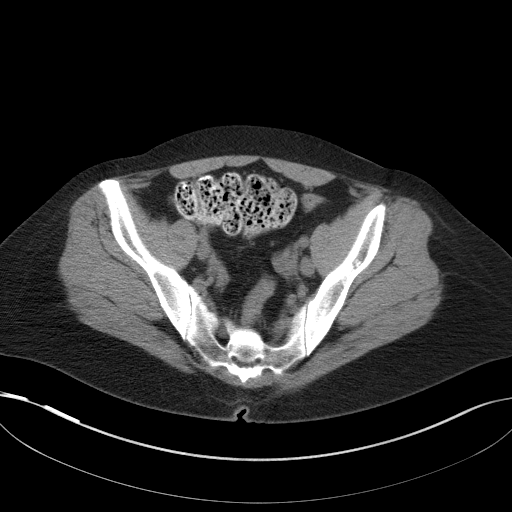
[im 36/96  soft-tissue]
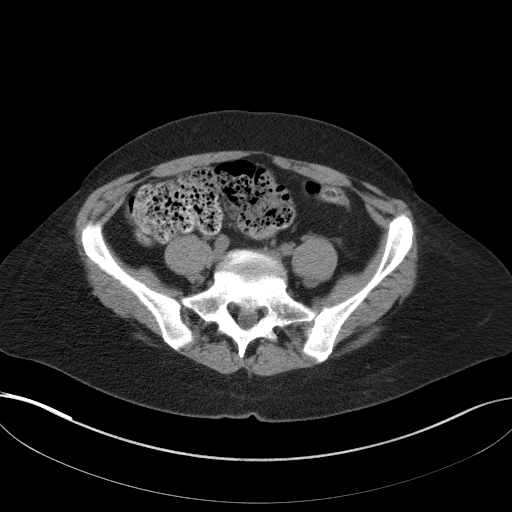
[im 44/96  soft-tissue]
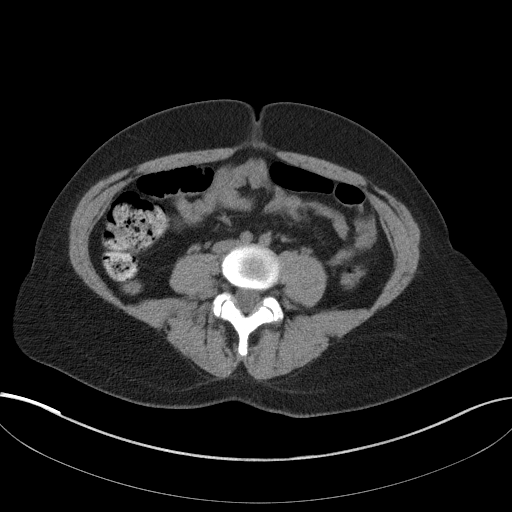
[im 52/96  soft-tissue]
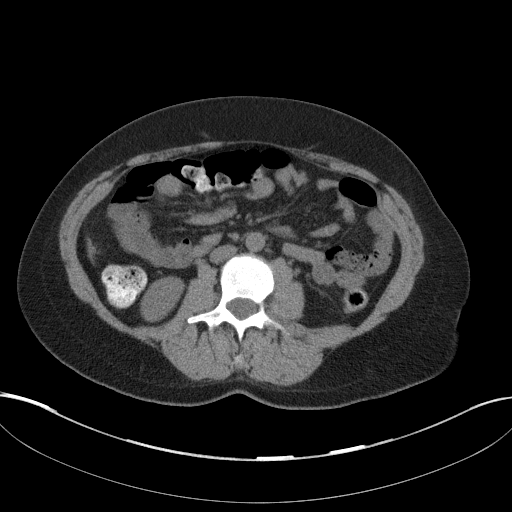
[im 60/96  soft-tissue]
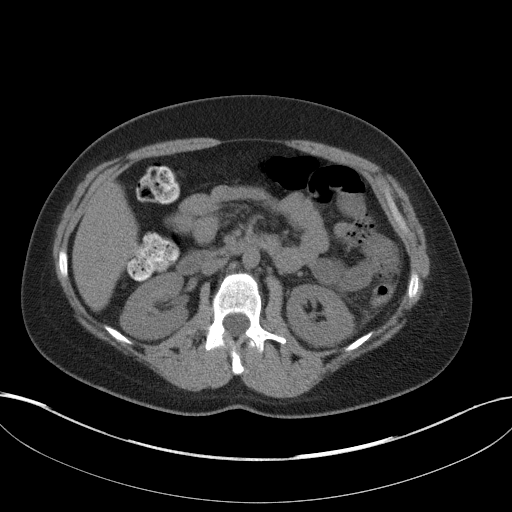
[im 68/96  soft-tissue]
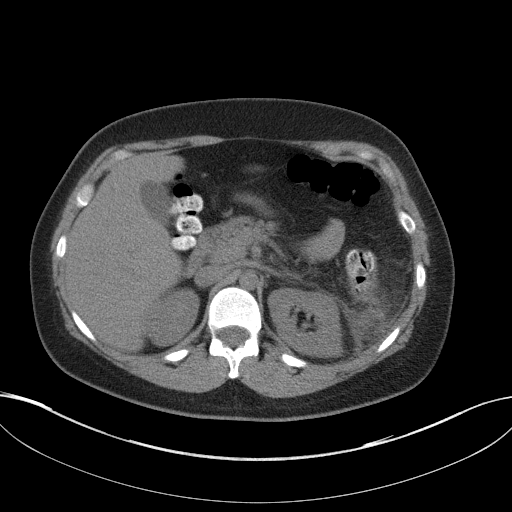
[im 68/96  bone]
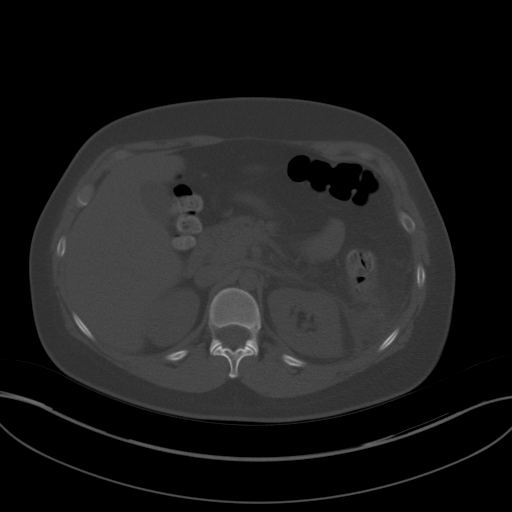
[im 76/96  soft-tissue]
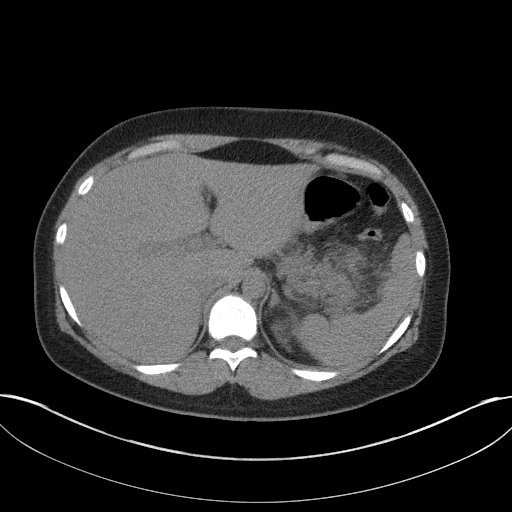
[im 84/96  soft-tissue]
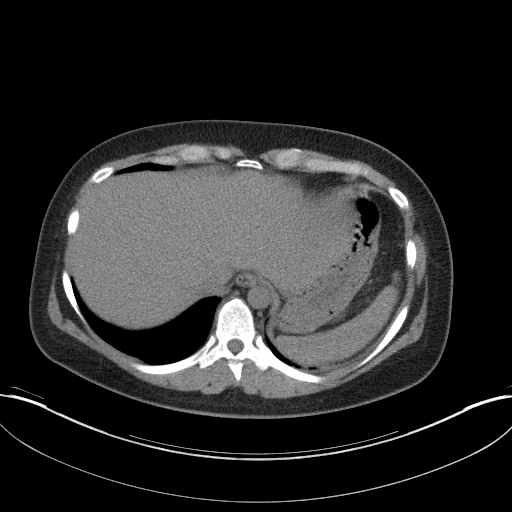
[im 92/96  soft-tissue]
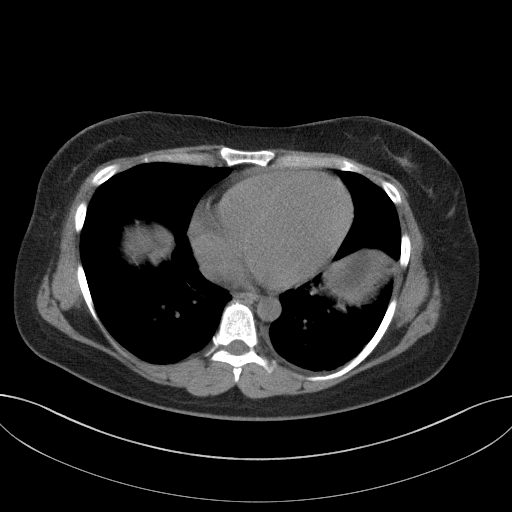

[Series 5: renal stone 3.0 coronal · coronal · 0.71mm/px · 3 of 86 slices shown]
[im 29/86  soft-tissue]
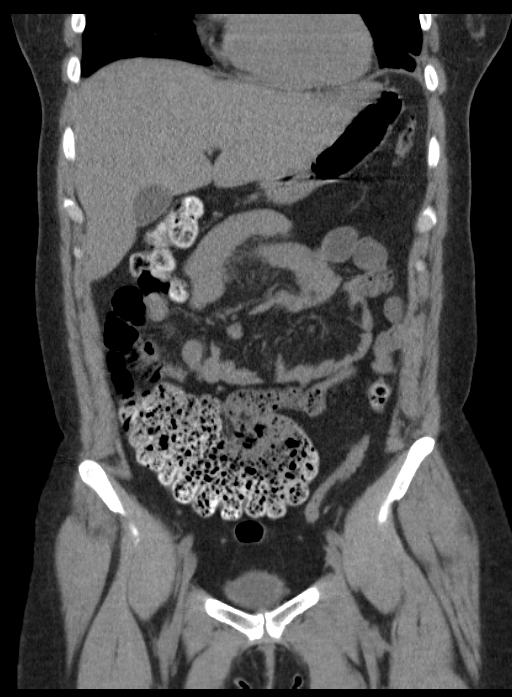
[im 38/86  soft-tissue]
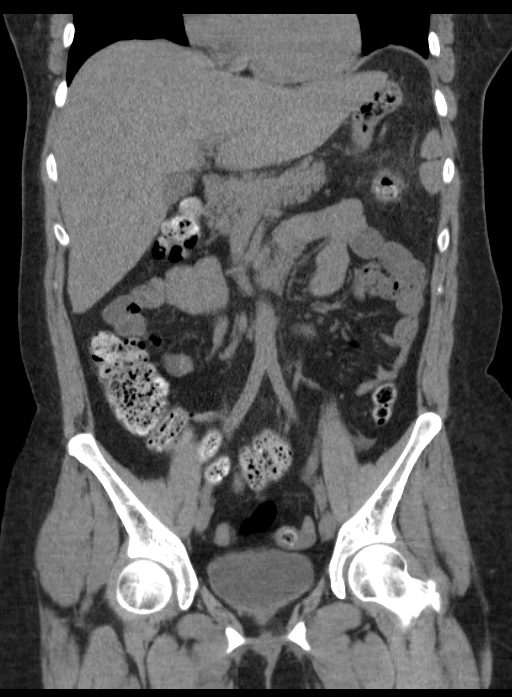
[im 48/86  soft-tissue]
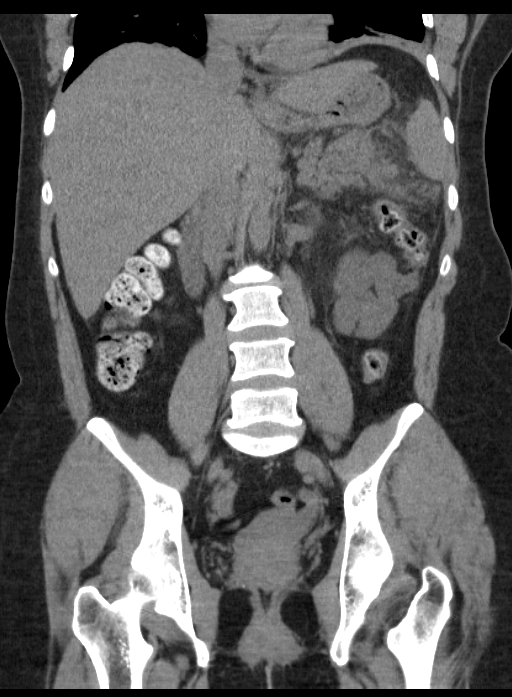

[15 of 46 positions shown; findings below may reference images not displayed]

FINDINGS: A small left pleural effusion with associated left basilar
atelectasis is present.

Limited noncontrast evaluation of the liver is unremarkable.
Gallbladder is within normal limits. Spleen is normal. Adrenal
glands are within normal limits.

The pancreatic body and tail are ill defined with extensive
inflammatory fat stranding within the adjacent peripancreatic fat in
the left upper quadrant (series 2, image 21). Findings are most
suggestive of acute pancreatitis. No loculated fluid collection to
suggest pseudocyst formation. Small amount of free fluid seen on the
inferior surface of the spleen. Evaluation for pancreatic necrosis
limited on this non contrast examination.

Scattered nonobstructive stones measuring up to 6 mm is present
within the right kidney. No obstructive uropathy on the right. No
left-sided renal calculi are identified. There is no left-sided
hydronephrosis or hydroureter.

No evidence of bowel obstruction. Appendix is normal. No abnormal
wall thickening or inflammatory fat stranding seen about the bowels.
There is mild thickening of the proximal descending colon at the
level of the splenic flexure, likely related to ongoing inflammatory
changes within the adjacent pancreas.

Bladder is unremarkable.  Uterus and ovaries within normal limits.

No free air identified. No enlarged intra-abdominal pelvic lymph
nodes.

No acute osseous abnormality. No worrisome lytic or blastic osseous
lesions.
IMPRESSION: 1. Ill definition of the pancreatic tail with extensive inflammatory
fat stranding within the adjacent peripancreatic fat in the left
upper quadrant. Findings are most compatible with acute
pancreatitis. Correlation with amylase/lipase recommended. No
loculated fluid collection identified.
2. Nonobstructive right renal nephrolithiasis as above. No CT
evidence of obstructive uropathy. No left-sided renal calculi
identified.
3. Small left pleural effusion.
